# Patient Record
Sex: Male | Born: 1960 | Race: Black or African American | Hispanic: No | Marital: Single | Smoking: Current every day smoker
Health system: Southern US, Community
[De-identification: ages and names within clinical notes are randomized; demographics above are authoritative.]

---

## 2009-10-09 ENCOUNTER — Emergency Department (HOSPITAL_COMMUNITY): Admission: EM | Admit: 2009-10-09 | Discharge: 2009-10-09 | Payer: Self-pay | Admitting: Family Medicine

## 2010-04-09 ENCOUNTER — Emergency Department (HOSPITAL_COMMUNITY): Admission: EM | Admit: 2010-04-09 | Discharge: 2010-04-09 | Payer: Self-pay | Admitting: Family Medicine

## 2010-12-23 LAB — POCT URINALYSIS DIP (DEVICE)
Nitrite: NEGATIVE
Protein, ur: NEGATIVE mg/dL
Specific Gravity, Urine: 1.02 (ref 1.005–1.030)
Urobilinogen, UA: 1 mg/dL (ref 0.0–1.0)

## 2010-12-23 LAB — POCT I-STAT, CHEM 8
BUN: <3 mg/dL — ABNORMAL LOW (ref 6–23)
Calcium, Ion: 1.15 mmol/L (ref 1.12–1.32)
Glucose, Bld: 113 mg/dL — ABNORMAL HIGH (ref 70–99)

## 2011-03-29 ENCOUNTER — Emergency Department (HOSPITAL_COMMUNITY): Payer: No Typology Code available for payment source

## 2011-03-29 ENCOUNTER — Emergency Department (HOSPITAL_COMMUNITY)
Admission: EM | Admit: 2011-03-29 | Discharge: 2011-03-29 | Disposition: A | Discharge status: Home / Self Care | Payer: No Typology Code available for payment source | Attending: Emergency Medicine | Admitting: Emergency Medicine

## 2011-03-29 DIAGNOSIS — M79609 Pain in unspecified limb: Secondary | ICD-10-CM | POA: Insufficient documentation

## 2011-03-29 DIAGNOSIS — T148XXA Other injury of unspecified body region, initial encounter: Secondary | ICD-10-CM | POA: Insufficient documentation

## 2011-03-29 DIAGNOSIS — M25569 Pain in unspecified knee: Secondary | ICD-10-CM | POA: Insufficient documentation

## 2011-03-29 DIAGNOSIS — M25559 Pain in unspecified hip: Secondary | ICD-10-CM | POA: Insufficient documentation

## 2011-04-03 ENCOUNTER — Ambulatory Visit (HOSPITAL_COMMUNITY)
Admission: RE | Admit: 2011-04-03 | Discharge: 2011-04-03 | Disposition: A | Discharge status: Home / Self Care | Payer: No Typology Code available for payment source | Source: Ambulatory Visit | Attending: Chiropractic Medicine | Admitting: Chiropractic Medicine

## 2011-04-03 ENCOUNTER — Other Ambulatory Visit (HOSPITAL_COMMUNITY): Payer: Self-pay | Admitting: Chiropractic Medicine

## 2011-04-03 DIAGNOSIS — R52 Pain, unspecified: Secondary | ICD-10-CM

## 2011-04-03 DIAGNOSIS — M545 Low back pain, unspecified: Secondary | ICD-10-CM | POA: Insufficient documentation
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
## 2011-10-02 ENCOUNTER — Encounter: Payer: Self-pay | Admitting: *Deleted

## 2011-10-02 ENCOUNTER — Emergency Department (INDEPENDENT_AMBULATORY_CARE_PROVIDER_SITE_OTHER)
Admission: EM | Admit: 2011-10-02 | Discharge: 2011-10-02 | Disposition: A | Discharge status: Home / Self Care | Payer: Self-pay | Source: Home / Self Care | Attending: Emergency Medicine | Admitting: Emergency Medicine

## 2011-10-02 DIAGNOSIS — IMO0001 Reserved for inherently not codable concepts without codable children: Secondary | ICD-10-CM

## 2011-10-02 DIAGNOSIS — M791 Myalgia, unspecified site: Secondary | ICD-10-CM

## 2011-10-02 DIAGNOSIS — J040 Acute laryngitis: Secondary | ICD-10-CM

## 2011-10-02 MED ORDER — MELOXICAM 7.5 MG PO TABS: 7.5000 mg | ORAL_TABLET | Freq: Every day | ORAL | Status: AC

## 2011-10-02 MED ORDER — CYCLOBENZAPRINE HCL 10 MG PO TABS: 10.0000 mg | ORAL_TABLET | Freq: Two times a day (BID) | ORAL | Status: AC | PRN

## 2011-10-02 NOTE — ED Notes (Signed)
Isaac Kramer  Reports  He  Woke up    4  Days  Ago  With  Symptoms  Of  Stiffneck  With pain     He  denys  Any  specefic  Injury  He  Reports  The  Pain is  Worse on movement  - he  Also  reportts  Now  His  Voice  Is  Hoarse  And  Has  Some  Swelling of neck lymph nodes

## 2011-10-02 NOTE — ED Provider Notes (Signed)
History     CSN: [id number]  Arrival date & time 10/02/11  1221   First MD Initiated Contact with Patient 10/02/11 1328      Chief Complaint  Patient presents with  . Neck Pain    (Consider location/radiation/quality/duration/timing/severity/associated sxs/prior treatment) HPI Comments: I think Friday started with pian on my neck, "like stiff" (points to R posterior aspect of upper back) also  My voice is hoarseD for about 3 days" No fevers, No SOB, No injuries, No COUGH  Patient is a 50 y.o. male presenting with neck pain. The history is provided by the patient.  Neck Pain  This is a new (4 days ago") problem. The problem occurs constantly. The problem has not changed since onset.Pertinent negatives include no headaches and no weakness.    History reviewed. No pertinent past medical history.  History reviewed. No pertinent past surgical history.  History reviewed. No pertinent family history.  History  Substance Use Topics  . Smoking status: Current Everyday Smoker  . Smokeless tobacco: Not on file  . Alcohol Use: Yes      Review of Systems  Constitutional: Negative for diaphoresis.  HENT: Positive for neck pain and neck stiffness. Negative for hearing loss, ear pain, congestion, rhinorrhea, sneezing and ear discharge.   Respiratory: Negative for cough.   Cardiovascular: Negative.   Musculoskeletal: Positive for joint swelling.  Neurological: Negative for weakness and headaches.  Hematological: Negative for adenopathy.    Allergies  Review of patient's allergies indicates no known allergies.  Home Medications   Current Outpatient Rx  Name Route Sig Dispense Refill  . CYCLOBENZAPRINE HCL 10 MG PO TABS Oral Take 1 tablet (10 mg total) by mouth 2 (two) times daily as needed for muscle spasms. 20 tablet 0  . MELOXICAM 7.5 MG PO TABS Oral Take 1 tablet (7.5 mg total) by mouth daily. 14 tablet 0    BP 111/71  Pulse 77  Temp(Src) 98.2 F (36.8 C) (Oral)  Resp  17  SpO2 95%  Physical Exam  Nursing note and vitals reviewed. Constitutional: He appears well-developed and well-nourished. No distress.  HENT:  Head: Normocephalic and atraumatic.  Mouth/Throat: Mucous membranes are normal. Mucous membranes are not pale. No oropharyngeal exudate, posterior oropharyngeal edema, posterior oropharyngeal erythema or tonsillar abscesses.  Eyes: Conjunctivae are normal.  Neck: Trachea normal and normal range of motion. Neck supple. Normal carotid pulses, no hepatojugular reflux and no JVD present. Muscular tenderness present. No tracheal tenderness and no spinous process tenderness present. Carotid bruit is not present. No rigidity. No tracheal deviation, no edema, no erythema and normal range of motion present. Brudzinski's sign noted. No Kernig's sign noted. No mass and no thyromegaly present.    Cardiovascular: Normal rate and regular rhythm.   Pulmonary/Chest: Effort normal.  Lymphadenopathy:    He has no cervical adenopathy.  Skin: Skin is warm. No erythema.    ED Course  Procedures (including critical care time)  Labs Reviewed - No data to display No results found.   1. Muscle pain   2. Laryngitis       MDM  Exam consistent with trapezium localized pain- co-existent laryngitis        Jimmie Molly, MD 10/02/11 1530

## 2013-12-15 ENCOUNTER — Encounter (HOSPITAL_COMMUNITY): Payer: Self-pay | Admitting: Emergency Medicine

## 2013-12-15 ENCOUNTER — Emergency Department (INDEPENDENT_AMBULATORY_CARE_PROVIDER_SITE_OTHER): Payer: Self-pay

## 2013-12-15 ENCOUNTER — Emergency Department (INDEPENDENT_AMBULATORY_CARE_PROVIDER_SITE_OTHER)
Admission: EM | Admit: 2013-12-15 | Discharge: 2013-12-15 | Disposition: A | Discharge status: Home / Self Care | Payer: Self-pay | Source: Home / Self Care | Attending: Family Medicine | Admitting: Family Medicine

## 2013-12-15 DIAGNOSIS — IMO0001 Reserved for inherently not codable concepts without codable children: Secondary | ICD-10-CM

## 2013-12-15 DIAGNOSIS — M7918 Myalgia, other site: Secondary | ICD-10-CM

## 2013-12-15 MED ORDER — TRAMADOL HCL 50 MG PO TABS: 50.0000 mg | ORAL_TABLET | Freq: Four times a day (QID) | ORAL | Status: AC | PRN

## 2013-12-15 MED ORDER — DICLOFENAC SODIUM 75 MG PO TBEC: 75.0000 mg | DELAYED_RELEASE_TABLET | Freq: Two times a day (BID) | ORAL | Status: AC | PRN

## 2013-12-15 MED ORDER — CYCLOBENZAPRINE HCL 10 MG PO TABS: 10.0000 mg | ORAL_TABLET | Freq: Every evening | ORAL | Status: AC | PRN

## 2013-12-15 MED ORDER — KETOROLAC TROMETHAMINE 60 MG/2ML IM SOLN
INTRAMUSCULAR | Status: AC
  Filled 2013-12-15: qty 2

## 2013-12-15 MED ORDER — KETOROLAC TROMETHAMINE 60 MG/2ML IM SOLN
60.0000 mg | Freq: Once | INTRAMUSCULAR | Status: AC
  Administered 2013-12-15: 60 mg via INTRAMUSCULAR

## 2013-12-15 NOTE — ED Provider Notes (Signed)
Isaac Kramer is a 53 y.o. male who presents to Urgent Care today for left rib and back pain. Patient has had pain at the anterior left lower rib and bilateral low back for about 2 weeks. Pain started after helping a friend move furniture. Patient denies any injury shortness of breath or palpitations. He feels well otherwise. The pain is moderate to severe worsens with activity. He denies any radiating pain weakness numbness or shortness of breath.   History reviewed. No pertinent past medical history. History  Substance Use Topics  . Smoking status: Current Every Day Smoker  . Smokeless tobacco: Not on file  . Alcohol Use: Yes   ROS as above Medications: Current Facility-Administered Medications  Medication Dose Route Frequency Provider Last Rate Last Dose  . ketorolac (TORADOL) injection 60 mg  60 mg Intramuscular Once Rodolph BongEvan S Lew Prout, MD       Current Outpatient Prescriptions  Medication Sig Dispense Refill  . cyclobenzaprine (FLEXERIL) 10 MG tablet Take 1 tablet (10 mg total) by mouth at bedtime as needed for muscle spasms.  20 tablet  0  . diclofenac (VOLTAREN) 75 MG EC tablet Take 1 tablet (75 mg total) by mouth 2 (two) times daily as needed.  60 tablet  0  . traMADol (ULTRAM) 50 MG tablet Take 1 tablet (50 mg total) by mouth every 6 (six) hours as needed.  15 tablet  0    Exam:  BP 118/73  Pulse 84  Temp(Src) 98.5 F (36.9 C) (Oral)  Resp 14  SpO2 100% Gen: Well NAD healthy and fit-appearing HEENT: EOMI,  MMM Chest wall: Tender palpation left anterior to lateral chest wall Lungs: Normal work of breathing. CTABL Heart: RRR no MRG Abd: NABS, Soft. NT, ND Exts: Brisk capillary refill, warm and well perfused.  Back: Nontender to spinal midline. Tender palpation bilateral lumbar and thoracic paraspinals. Range of motion decreased flexion and extension secondary to pain. Negative straight leg raise test bilaterally. Reflexes and strength are intact bilateral lower  extremities  Twelve-lead EKG shows normal sinus rhythm at 81 beats per minute. J-point elevation leads V3, V4. Otherwise normal.  No results found for this or any previous visit (from the past 24 hour(s)). Dg Ribs Unilateral W/chest Left  12/15/2013   CLINICAL DATA Left chest pain  EXAM LEFT RIBS AND CHEST - 3+ VIEW  COMPARISON None.  FINDINGS No fracture or other bone lesions are seen involving the ribs. There is no evidence of pneumothorax or pleural effusion. Both lungs are clear. Heart size and mediastinal contours are within normal limits.  IMPRESSION Negative.  SIGNATURE  Electronically Signed   By: Marlan Palauharles  Clark M.D.   On: 12/15/2013 13:50    Assessment and Plan: 53 y.o. male with chest wall and lumbar back muscle strain. Pain started after moving heavy objects.  The patient does not exhibit any signs of coronary artery disease or PE. His vital signs are within normal limits and his EKG is normal for a healthy fit man. Plan to treat muscle strain with diclofenac, tramadol, and Flexeril as well as heating pad and home exercise program.  Discussed warning signs or symptoms. Please see discharge instructions. Patient expresses understanding.    Rodolph BongEvan S Shila Kruczek, MD 12/15/13 1415

## 2013-12-15 NOTE — ED Notes (Signed)
States he has not felt well x 2 weeks, started 2 days after helped somebody move furniture

## 2013-12-15 NOTE — Discharge Instructions (Signed)
Thank you for coming in today. Use a heating pad Take the diclofenac twice daily starting tonight Use Flexeril at bedtime as needed Use tramadol for severe pain. Do not drive after taking this medication.  Come back or go to the emergency room if you notice new weakness new numbness problems walking or bowel or bladder problems.  Chest Wall Pain Chest wall pain is pain in or around the bones and muscles of your chest. It may take up to 6 weeks to get better. It may take longer if you must stay physically active in your work and activities.  CAUSES  Chest wall pain may happen on its own. However, it may be caused by:  A viral illness like the flu.  Injury.  Coughing.  Exercise.  Arthritis.  Fibromyalgia.  Shingles. HOME CARE INSTRUCTIONS   Avoid overtiring physical activity. Try not to strain or perform activities that cause pain. This includes any activities using your chest or your abdominal and side muscles, especially if heavy weights are used.  Put ice on the sore area.  Put ice in a plastic bag.  Place a towel between your skin and the bag.  Leave the ice on for 15-20 minutes per hour while awake for the first 2 days.  Only take over-the-counter or prescription medicines for pain, discomfort, or fever as directed by your caregiver. SEEK IMMEDIATE MEDICAL CARE IF:   Your pain increases, or you are very uncomfortable.  You have a fever.  Your chest pain becomes worse.  You have new, unexplained symptoms.  You have nausea or vomiting.  You feel sweaty or lightheaded.  You have a cough with phlegm (sputum), or you cough up blood. MAKE SURE YOU:   Understand these instructions.  Will watch your condition.  Will get help right away if you are not doing well or get worse. Document Released: 09/23/2005 Document Revised: 12/16/2011 Document Reviewed: 05/20/2011 [facility] LPExit[facility] Patient Information 2014 [place], [place].

## 2014-03-01 NOTE — ED Notes (Signed)
Pharmacy called questioning if tramadol could be filled without the other two scripts being filled.  Dr Roslynn Amble agreed

## 2019-06-02 ENCOUNTER — Ambulatory Visit: Payer: Self-pay | Admitting: Internal Medicine

## 2020-10-03 ENCOUNTER — Other Ambulatory Visit: Payer: Self-pay

## 2020-10-03 DIAGNOSIS — Z20822 Contact with and (suspected) exposure to covid-19: Secondary | ICD-10-CM

## 2020-10-04 ENCOUNTER — Ambulatory Visit: Payer: Self-pay

## 2020-10-04 LAB — SARS-COV-2, NAA 2 DAY TAT

## 2020-10-04 LAB — NOVEL CORONAVIRUS, NAA: SARS-CoV-2, NAA: DETECTED — AB

## 2020-10-04 NOTE — Telephone Encounter (Signed)
ret'd call to pt. Advised pt. of COVID results; the virus was detected, and the pt. can spread the germ to other people.  Reported onset of symptoms of mild cold symptoms on 12/23.  Also lost his taste and smell.  Advised of CDC guidelines for self isolation/ ending isolation. Advised that new guidelines recommend quarantining x 5 days and to continue wearing a mask for 5 additional days, after quarantine ends.  Advised of safe practice guidelines; frequent handwashing, wearing mask when in proximity of other members in same household, and disinfecting commonly touched surfaces, within the home environment.  Symptom Tier reviewed; encouraged to monitor for worsening symptoms, and to contact their PCP.  Advised that pt. can take OTC medication to treat the symptoms.  Be sure to follow any guidelines from PCP re: the use of OTC medications.  Advised when to seek emergency care.  Instructed to rest and hydrate well and to monitor temperature daily.  Advised to only leave the house during recommended isolation period, if it is necessary to seek medical care.  Advised the Health Dept. will be notified.  Pt. Verb. understanding of instructions.     Reason for Disposition . [1] [id number] diagnosed by positive lab test (e.g., PCR, rapid self-test kit) AND [2] mild symptoms (e.g., cough, fever, others) AND [7] no complications or SOB  Answer Assessment - Initial Assessment Questions 1. COVID-19 DIAGNOSIS: "Who made your COVID-19 diagnosis?" "Was it confirmed by a positive lab test?" If not diagnosed by a HCP, ask "Are there lots of cases (community spread) where you live?" Note: See public health department website, if unsure.     Tested positive on 12/28 2. COVID-19 EXPOSURE: "Was there any known exposure to COVID before the symptoms began?" CDC Definition of close contact: within 6 feet (2 meters) for a total of 15 minutes or more over a 24-hour period.      No  3. ONSET: "When did the COVID-19 symptoms start?"       Loss of taste/ smell on 12/23 4. WORST SYMPTOM: "What is your worst symptom?" (e.g., cough, fever, shortness of breath, muscle aches)     Loss of taste or smell 5. COUGH: "Do you have a cough?" If Yes, ask: "How bad is the cough?"       Denied  6. FEVER: "Do you have a fever?" If Yes, ask: "What is your temperature, how was it measured, and when did it start?"     Denied  7. RESPIRATORY STATUS: "Describe your breathing?" (e.g., shortness of breath, wheezing, unable to speak)      Denied any breathing issues  8. BETTER-SAME-WORSE: "Are you getting better, staying the same or getting worse compared to yesterday?"  If getting worse, ask, "In what way?"     Feeling same as 2 days ago; slightly better 9. HIGH RISK DISEASE: "Do you have any chronic medical problems?" (e.g., asthma, heart or lung disease, weak immune system, obesity, etc.)     Denied  10. VACCINE: "Have you gotten the COVID-19 vaccine?" If Yes ask: "Which one, how many shots, when did you get it?"       Has not been vaccinated. 11. PREGNANCY: "Is there any chance you are pregnant?" "When was your last menstrual period?"       N/a  12. OTHER SYMPTOMS: "Do you have any other symptoms?"  (e.g., chills, fatigue, headache, loss of smell or taste, muscle pain, sore throat; new loss of smell or taste especially support the diagnosis of  COVID-19)       Mild cold symptoms with hoarseness, stuffy nose, and phlegm in throat.  Denied cough; denied SOB, or shortness of breath  Protocols used: CORONAVIRUS (COVID-19) DIAGNOSED OR SUSPECTED-A-AH  Message from Loma Boston sent at 10/04/2020 5:10 PM EST  Summary: CB    Please call pt back re covid positive,(positive not divulged) pt states has returned back 3 times today for his results and left messages and no one is returning call [phone] held NT line for several min.

## 2020-10-09 ENCOUNTER — Ambulatory Visit: Payer: Self-pay

## 2020-10-09 NOTE — Telephone Encounter (Signed)
   ES    2        Isaac Kramer Male, 60 y.o., May 13, 1961  MRN:  [id number] Phone:  [phone] (H) ...       PCP:  Patient, No Pcp Per Coverage:  None          Message from Stephannie Li sent at 10/09/2020 12:11 PM EST  Patient called and has questions concerning covid,he says he is still symptoms ,no taste or smell and he also has a cold.He would like to know when he can return to work .Please call him at [phone]    Call History   Type Contact Phone/Fax User  10/09/2020 12:47 PM EST Phone ([address] Isaac, Kramer (Self) [phone] Judie Petit) Esther Hardy, RN  No Answer/Busy -  Voice mailbox not set up.  10/09/2020 12:11 PM EST Phone (Incoming) Isaac, Kramer (Self) [phone] Judie Petit) Stephannie Li   Encounter Report  Patient Encounter Report    Pt. Reports he is still having congestion from his COVID 19 diagnosis. States his employer is telling him to come back to work. He does not have a PCP. Will call [facility] Dept. Reviewed [place]'s quarantine recommendations.

## 2020-10-17 ENCOUNTER — Other Ambulatory Visit: Payer: Self-pay
